# Patient Record
Sex: Female | Born: 1960 | Race: White | Hispanic: No | Marital: Married
Health system: Southern US, Community
[De-identification: ages and names within clinical notes are randomized; demographics above are authoritative.]

---

## 2004-04-05 ENCOUNTER — Other Ambulatory Visit: Admission: RE | Admit: 2004-04-05 | Discharge: 2004-04-05 | Payer: Self-pay | Admitting: Family Medicine

## 2006-06-02 ENCOUNTER — Ambulatory Visit: Payer: Self-pay | Admitting: Family Medicine

## 2006-06-10 ENCOUNTER — Ambulatory Visit: Payer: Self-pay | Admitting: Family Medicine

## 2006-06-10 ENCOUNTER — Other Ambulatory Visit: Admission: RE | Admit: 2006-06-10 | Discharge: 2006-06-10 | Payer: Self-pay | Admitting: Family Medicine

## 2007-10-07 ENCOUNTER — Other Ambulatory Visit: Admission: RE | Admit: 2007-10-07 | Discharge: 2007-10-07 | Payer: Self-pay | Admitting: Family Medicine

## 2007-10-07 ENCOUNTER — Ambulatory Visit: Payer: Self-pay | Admitting: Family Medicine

## 2007-12-14 ENCOUNTER — Ambulatory Visit: Payer: Self-pay | Admitting: Family Medicine

## 2008-02-08 ENCOUNTER — Ambulatory Visit: Payer: Self-pay | Admitting: Family Medicine

## 2008-10-18 ENCOUNTER — Ambulatory Visit: Payer: Self-pay | Admitting: Family Medicine

## 2008-10-18 ENCOUNTER — Other Ambulatory Visit: Admission: RE | Admit: 2008-10-18 | Discharge: 2008-10-18 | Payer: Self-pay | Admitting: Family Medicine

## 2008-11-21 ENCOUNTER — Ambulatory Visit: Payer: Self-pay | Admitting: Family Medicine

## 2008-11-21 ENCOUNTER — Encounter: Admission: RE | Admit: 2008-11-21 | Discharge: 2008-11-21 | Payer: Self-pay | Admitting: Family Medicine

## 2009-05-23 ENCOUNTER — Ambulatory Visit: Payer: Self-pay | Admitting: Family Medicine

## 2009-09-13 IMAGING — US US TRANSVAGINAL NON-OB
1 series · 14 of 25 positions shown · non-contrast
Comparison: Dictated report from that outside CT of the abdomen
pelvis

CLINICAL DATA: 2 cm left adnexal lesion noted on CT from [facility] dated 09/09/2008

TRANSABDOMINAL AND TRANSVAGINAL ULTRASOUND OF PELVIS
TECHNIQUE: Both transabdominal and transvaginal ultrasound
examinations of the pelvis were performed including evaluation of
the uterus, ovaries, adnexal regions, and pelvic cul-de-sac.

[Series 1: us transvaginal non-ob · 0.33mm/px · 87 acquisitions, 14 frames shown]
[im 1/87]
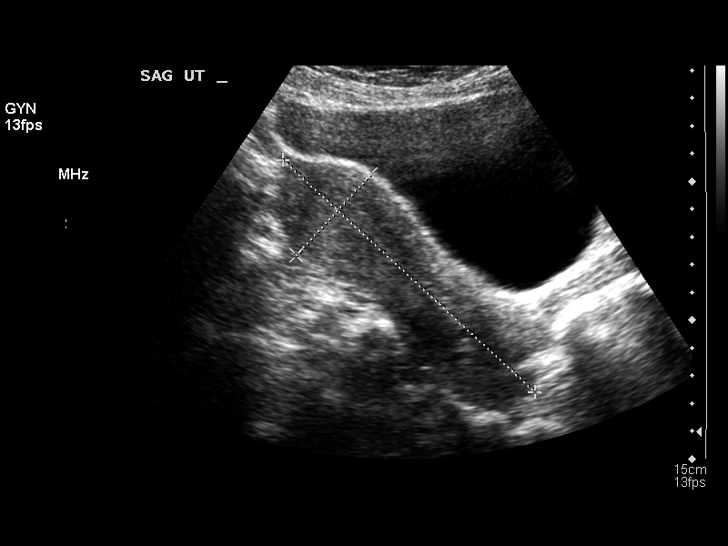
[im 8/87]
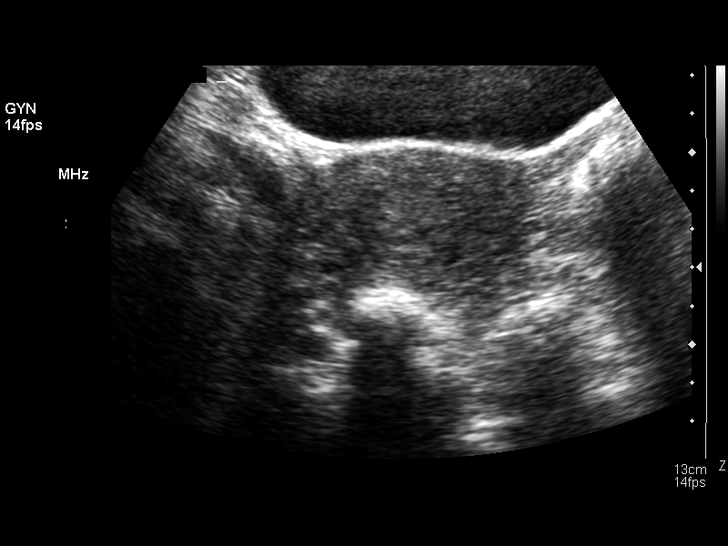
[im 15/87]
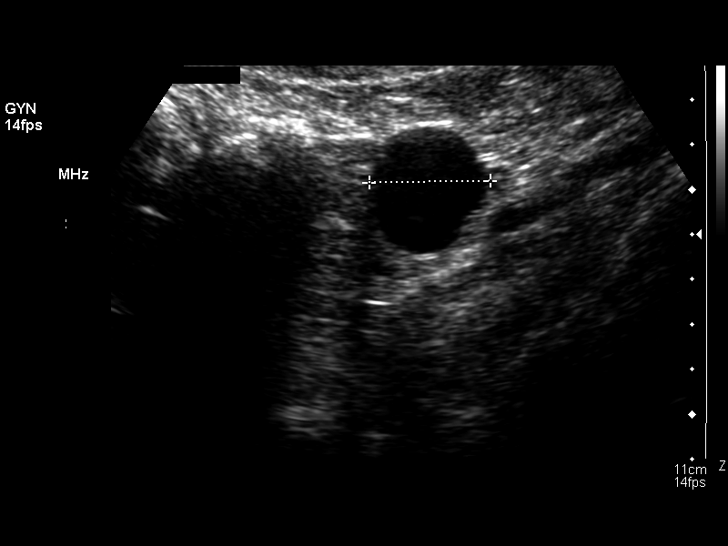
[im 22/87]
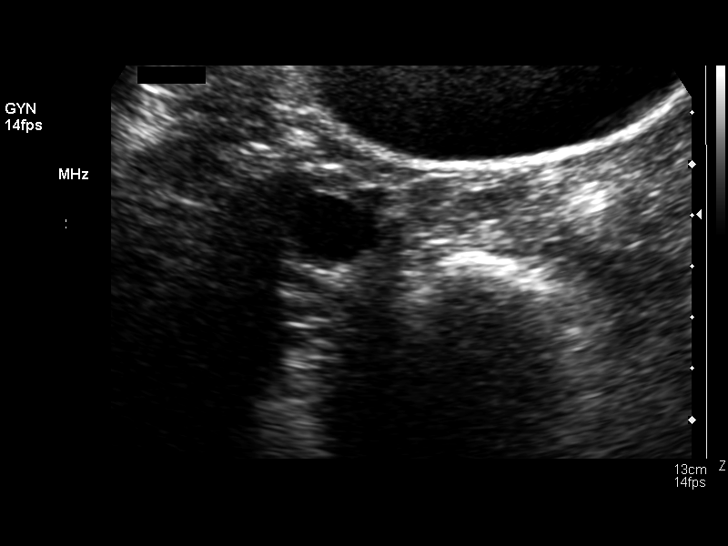
[im 29/87]
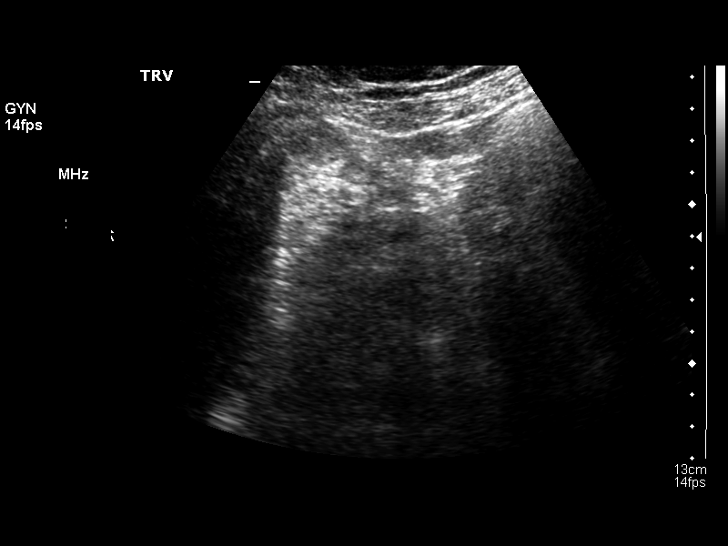
[im 33/87]
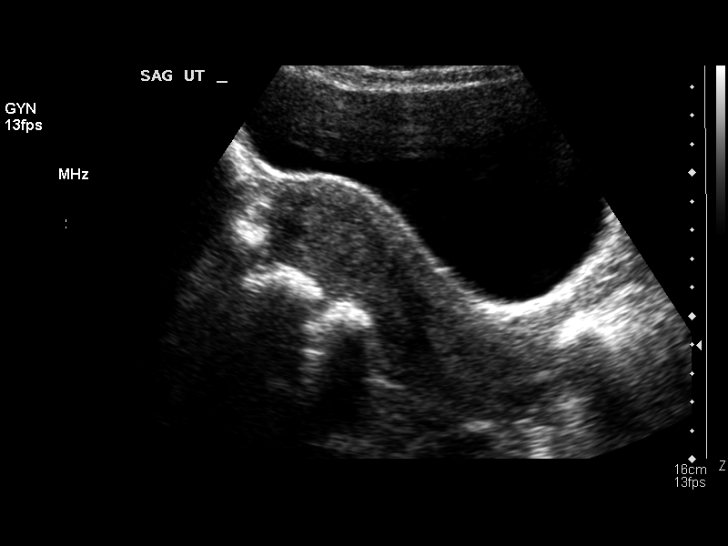
[im 40/87]
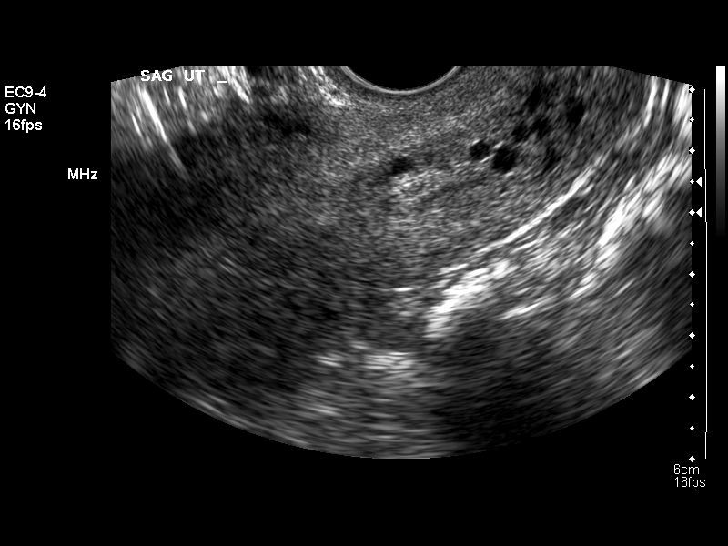
[im 47/87]
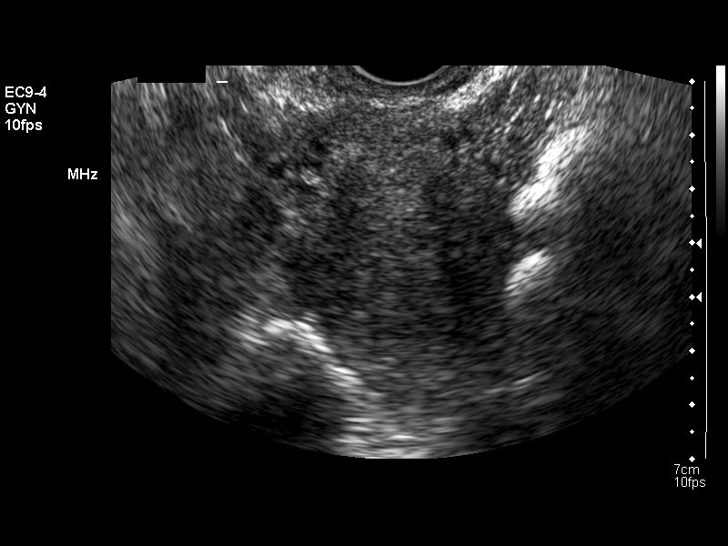
[im 54/87]
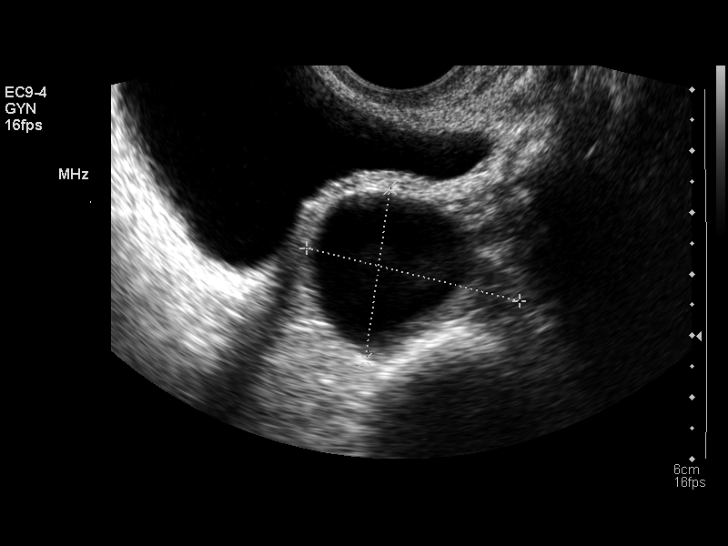
[im 58/87]
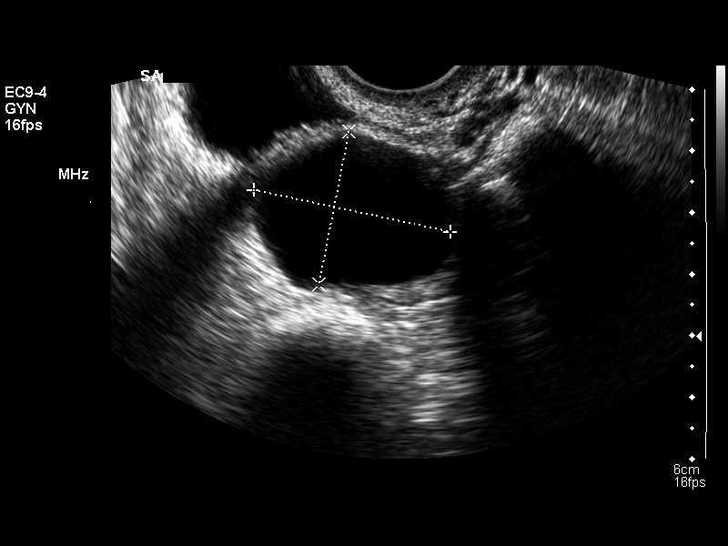
[im 65/87]
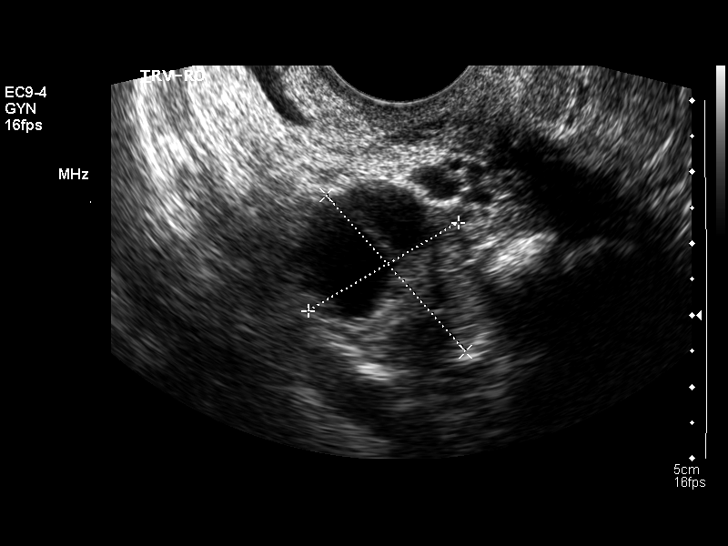
[im 72/87]
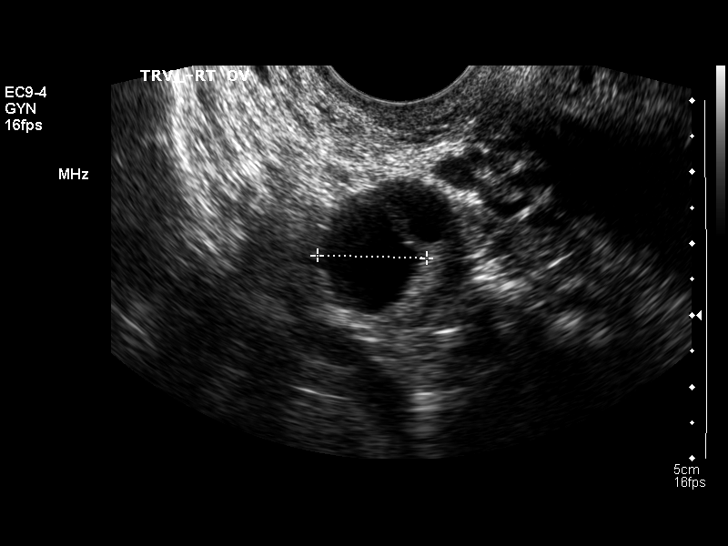
[im 79/87]
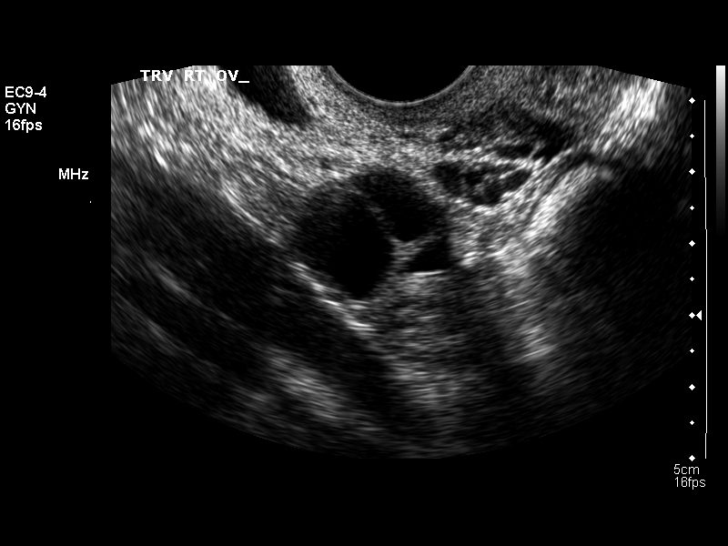
[im 87/87]
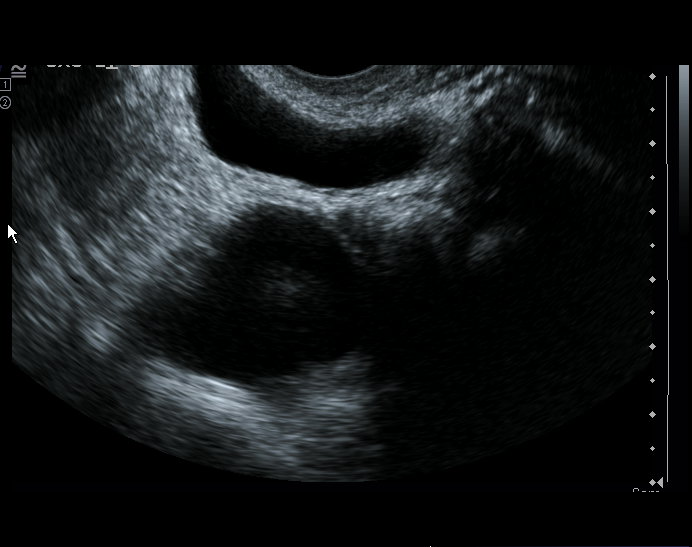

[14 of 25 positions shown; findings below may reference images not displayed]

FINDINGS: Uterus:  The uterus measures 10.8 cm sagittally with a depth of
cm and width of 5.6 cm.  Small Nabothian cysts are present.

Endometrium:  Endometrium appears homogeneous measuring 13.8 mm in
thickness which is within normal limits in this  patient.

Right Ovary:  The right ovary measures 2.9 x 2.4 x 2.7 cm.  Small
cysts are noted of 1.6 x 1.6 x 1.5 cm, and 0.9 x 1.4 x 1.4 cm.

Left Ovary:  The left ovary measures 4.1 x 2.8 x 3.6 cm.  There is
a simple appearing cyst of 3.3 x 2.5 x 3.0 cm.  No complicating
features are noted.

Other Findings:  No free fluid is seen.
IMPRESSION: 1.  3.3 cm simple appearing left ovarian cyst.  Small right ovarian
follicles.  No complicating features are evident.
 2.  The endometrium appears normal.

## 2009-12-12 ENCOUNTER — Ambulatory Visit: Payer: Self-pay | Admitting: Physician Assistant

## 2009-12-12 ENCOUNTER — Other Ambulatory Visit
Admission: RE | Admit: 2009-12-12 | Discharge: 2009-12-12 | Payer: Self-pay | Source: Home / Self Care | Admitting: Family Medicine

## 2010-06-12 ENCOUNTER — Ambulatory Visit: Payer: Self-pay | Admitting: Family Medicine

## 2010-09-09 ENCOUNTER — Encounter: Payer: Self-pay | Admitting: Family Medicine

## 2016-08-01 ENCOUNTER — Ambulatory Visit: Payer: Self-pay | Admitting: Sports Medicine

## 2016-08-02 ENCOUNTER — Ambulatory Visit (INDEPENDENT_AMBULATORY_CARE_PROVIDER_SITE_OTHER): Payer: Self-pay | Admitting: Sports Medicine

## 2016-08-02 ENCOUNTER — Encounter: Payer: Self-pay | Admitting: Sports Medicine

## 2016-08-02 ENCOUNTER — Ambulatory Visit (INDEPENDENT_AMBULATORY_CARE_PROVIDER_SITE_OTHER): Payer: Self-pay

## 2016-08-02 DIAGNOSIS — S92352A Displaced fracture of fifth metatarsal bone, left foot, initial encounter for closed fracture: Secondary | ICD-10-CM

## 2016-08-02 DIAGNOSIS — R609 Edema, unspecified: Secondary | ICD-10-CM

## 2016-08-02 DIAGNOSIS — M79672 Pain in left foot: Secondary | ICD-10-CM

## 2016-08-02 NOTE — Progress Notes (Signed)
Subjective: Stefanie Jones is a 55 y.o. female patient who presents to office for evaluation of  Left foot pain. Patient complains of progressive pain especially over the last few days. Patient went to urgent care earlier this week where she was diagnosed with metatarsal fracture. Patient states that when she was leaving work. She took a misstep and experienced pain and swelling. Patient reports that she was placed in a CAM boot and told to rest, ice and elevate the foot and to take meloxicam and to follow up with podiatrist. Patient denies any other pedal complaints.   There are no active problems to display for this patient.   No current outpatient prescriptions on file prior to visit.   No current facility-administered medications on file prior to visit.     Not on File  Objective:  General: Alert and oriented x3 in no acute distress  Dermatology: No open lesions bilateral lower extremities, no webspace macerations, no ecchymosis bilateral, all nails x 10 are well manicured.  Vascular: Focal edema noted to Dorsal lateral aspect of the left foot. Dorsalis Pedis and Posterior Tibial pedal pulses 2/4, Capillary Fill Time 3 seconds,(+) pedal hair growth bilateral, Temperature gradient within normal limits.  Neurology: Michaell CowingGross sensation intact via light touch bilateral.  Musculoskeletal: There is tenderness with palpation at fifth metatarsal, especially at the base on the left foot, there is pain present with application of tuning fork on the left foot, No pain with calf compression bilateral. Strength within normal limits for patient status.   Patient brought in x-rays on CD, which I reviewed from urgent care dated 07/30/2016 which reveals a nondisplaced transverse fracture of the base of the fifth metatarsal with no comminution extra-articular with mild midfoot arthritis and no other acute findings    Assessment and Plan: Problem List Items Addressed This Visit    None    Visit Diagnoses     Closed displaced fracture of fifth metatarsal bone of left foot, initial encounter    -  Primary   Swelling       Left foot pain       Relevant Orders   DG Foot Complete Left      -Complete examination performed -Xrays reviewed -Discussed treatement options for fracture; risks, alternatives, and benefits explained. 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 I advised patient if her job could not give her a lighter duty position that she will have to take medical leave from work until we get this fracture heal. Patient expressed understanding and will get paperwork sent to office to be completed on her behalf. -Patient to return to office in 4 weeks for serial x-rays to assess healing  or sooner if condition worsens.  Asencion Islamitorya Mikel Hardgrove, DPM

## 2016-09-06 ENCOUNTER — Ambulatory Visit (INDEPENDENT_AMBULATORY_CARE_PROVIDER_SITE_OTHER): Payer: BLUE CROSS/BLUE SHIELD | Admitting: Sports Medicine

## 2016-09-06 ENCOUNTER — Encounter: Payer: Self-pay | Admitting: Sports Medicine

## 2016-09-06 ENCOUNTER — Ambulatory Visit: Payer: Self-pay | Admitting: Sports Medicine

## 2016-09-06 ENCOUNTER — Ambulatory Visit (INDEPENDENT_AMBULATORY_CARE_PROVIDER_SITE_OTHER): Payer: BLUE CROSS/BLUE SHIELD

## 2016-09-06 DIAGNOSIS — S92355A Nondisplaced fracture of fifth metatarsal bone, left foot, initial encounter for closed fracture: Secondary | ICD-10-CM | POA: Diagnosis not present

## 2016-09-06 DIAGNOSIS — Q828 Other specified congenital malformations of skin: Secondary | ICD-10-CM | POA: Diagnosis not present

## 2016-09-06 DIAGNOSIS — S92355D Nondisplaced fracture of fifth metatarsal bone, left foot, subsequent encounter for fracture with routine healing: Secondary | ICD-10-CM

## 2016-09-06 DIAGNOSIS — M79671 Pain in right foot: Secondary | ICD-10-CM

## 2016-09-06 DIAGNOSIS — L309 Dermatitis, unspecified: Secondary | ICD-10-CM

## 2016-09-06 DIAGNOSIS — M79672 Pain in left foot: Secondary | ICD-10-CM

## 2016-09-06 MED ORDER — DESONIDE 0.05 % EX OINT: 1.0000 "application " | TOPICAL_OINTMENT | Freq: Two times a day (BID) | CUTANEOUS | 0 refills | Status: AC

## 2016-09-06 NOTE — Progress Notes (Signed)
Subjective: Stefanie Jones is a 56 y.o. female patient who returns to office for evaluation of left foot fracture, right 3-4 toes color change with burning sensation and hard lesions on bottom of right foot concerning for warts; states that she had warts as a child. Patient denies any other pedal complaints.   There are no active problems to display for this patient.   Current Outpatient Prescriptions on File Prior to Visit  Medication Sig Dispense Refill  . lisinopril (PRINIVIL,ZESTRIL) 5 MG tablet     . meloxicam (MOBIC) 7.5 MG tablet      No current facility-administered medications on file prior to visit.     Not on File  Objective:  General: Alert and oriented x3 in no acute distress  Dermatology: Scaly rash to right 3-4 toes, Porokeratosis sub met 2-3 on right, No open lesions bilateral lower extremities, no webspace macerations, no ecchymosis bilateral, all nails x 10 are well manicured.  Vascular: Focal edema noted to Dorsal lateral aspect of the left foot. Dorsalis Pedis and Posterior Tibial pedal pulses 2/4, Capillary Fill Time 3 seconds,(+) pedal hair growth bilateral, Temperature gradient within normal limits.  Neurology: Gross sensation intact via light touch bilateral.  Musculoskeletal: No pain to right 3-4 toes.There is decreased tenderness with palpation at fifth metatarsal, especially at the base on the left foot, there is pain present with application of tuning fork on the left foot, No pain with calf compression bilateral. Strength within normal limits for patient status.   Xray left foot- fifth metatarsal fracture with no comminution extra-articular with mild midfoot arthritis and no other acute findings    Assessment and Plan: Problem List Items Addressed This Visit    None    Visit Diagnoses    Closed nondisplaced fracture of fifth metatarsal bone of left foot with routine healing, subsequent encounter    -  Primary   Relevant Orders   DG Foot Complete Left   Dermatitis       Relevant Medications   desonide (DESOWEN) 0.05 % ointment   Porokeratosis       Foot pain, bilateral          -Complete examination performed -Mechanically debrided keratosis sub met 2-3 on right foot using sterile chisel blade without incident  -Rx Desonide cream for right 3-4 toes -Xrays reviewed -Discussed treatement options for fracture; risks, alternatives, and benefits explained. -Continue with surgitube compression stockings daily -Continue with CAM walker to patient to wear at all times -Recommend protection, rest, ice, elevation daily until symptoms improve -Continue with antinflammatories as needed -Patient states job released her on medical leave until 09-17-16. I advised patient if she needs additional work notes to let office know; recommend she continue with no work until next appointment. -Patient to return to office in 4 weeks for serial x-rays to assess healing  or sooner if condition worsens.  Landis Martins, DPM

## 2016-09-09 ENCOUNTER — Encounter: Payer: Self-pay | Admitting: Sports Medicine

## 2016-10-04 ENCOUNTER — Ambulatory Visit (INDEPENDENT_AMBULATORY_CARE_PROVIDER_SITE_OTHER): Payer: BLUE CROSS/BLUE SHIELD

## 2016-10-04 ENCOUNTER — Ambulatory Visit (INDEPENDENT_AMBULATORY_CARE_PROVIDER_SITE_OTHER): Payer: BLUE CROSS/BLUE SHIELD | Admitting: Sports Medicine

## 2016-10-04 ENCOUNTER — Encounter: Payer: Self-pay | Admitting: Sports Medicine

## 2016-10-04 DIAGNOSIS — S92355D Nondisplaced fracture of fifth metatarsal bone, left foot, subsequent encounter for fracture with routine healing: Secondary | ICD-10-CM | POA: Diagnosis not present

## 2016-10-04 DIAGNOSIS — M79672 Pain in left foot: Secondary | ICD-10-CM

## 2016-10-04 NOTE — Progress Notes (Signed)
Subjective: Rondel JumboDonna V Paulding is a 56 y.o. female patient who returns to office for evaluation of left foot fracture. Patient has been wearing CAM boot and reports that she is concerned with her Leave from work, Patient denies any other pedal complaints.   There are no active problems to display for this patient.   Current Outpatient Prescriptions on File Prior to Visit  Medication Sig Dispense Refill  . desonide (DESOWEN) 0.05 % ointment Apply 1 application topically 2 (two) times daily. 15 g 0  . lisinopril (PRINIVIL,ZESTRIL) 5 MG tablet     . meloxicam (MOBIC) 7.5 MG tablet      No current facility-administered medications on file prior to visit.     Not on File  Objective:  General: Alert and oriented x3 in no acute distress  Dermatology: Left foot nails well manicured. No ecchymoses or skin rash on left.  Vascular: Decreased focal edema noted to Dorsal lateral aspect of the left foot. Dorsalis Pedis and Posterior Tibial pedal pulses 2/4, Capillary Fill Time 3 seconds,(+) pedal hair growth,Temperature gradient within normal limits.  Neurology: Gross sensation intact via light touch on left.  Musculoskeletal: There is decreased tenderness with palpation at fifth metatarsal, especially at the base on the left foot, No pain with calf compression. Strength within normal limits for patient status.   Xray left foot- fifth metatarsal fracture with 2mm gap only 30% improved with no comminution extra-articular with mild midfoot arthritis and no other acute findings    Assessment and Plan: Problem List Items Addressed This Visit    None    Visit Diagnoses    Closed nondisplaced fracture of fifth metatarsal bone of left foot with routine healing, subsequent encounter    -  Primary   Relevant Orders   DG Foot Complete Left   Left foot pain          -Complete examination performed -Xrays reviewed -Discussed treatement options and continued care fracture; risks, alternatives, and  benefits explained. -Discussed with patient will request bone stimulator at next visit if fracture is not healed -Continue with surgitube compression stockings daily -Continue with CAM walker to patient to wear at all times -Recommend protection, rest, ice, elevation daily until symptoms improve -Continue with antinflammatories as needed -Patient to continue with medical leave until fracture has healed; recommend continue with no work, return to work status will be re-assessed at next visit. -Patient to return to office in 4 weeks for serial x-rays to assess healing of fracture or sooner if condition worsens.  Asencion Islamitorya Chaniya Genter, DPM

## 2016-11-01 ENCOUNTER — Ambulatory Visit (INDEPENDENT_AMBULATORY_CARE_PROVIDER_SITE_OTHER): Payer: BLUE CROSS/BLUE SHIELD | Admitting: Sports Medicine

## 2016-11-01 ENCOUNTER — Encounter: Payer: Self-pay | Admitting: Sports Medicine

## 2016-11-01 ENCOUNTER — Ambulatory Visit (INDEPENDENT_AMBULATORY_CARE_PROVIDER_SITE_OTHER): Payer: BLUE CROSS/BLUE SHIELD

## 2016-11-01 DIAGNOSIS — S92355K Nondisplaced fracture of fifth metatarsal bone, left foot, subsequent encounter for fracture with nonunion: Secondary | ICD-10-CM | POA: Diagnosis not present

## 2016-11-01 DIAGNOSIS — M79672 Pain in left foot: Secondary | ICD-10-CM | POA: Diagnosis not present

## 2016-11-01 DIAGNOSIS — S92355D Nondisplaced fracture of fifth metatarsal bone, left foot, subsequent encounter for fracture with routine healing: Secondary | ICD-10-CM | POA: Diagnosis not present

## 2016-11-01 NOTE — Progress Notes (Signed)
Subjective: Stefanie Jones is a 56 y.o. female patient who returns to office for evaluation of left foot fracture. Patient has been wearing CAM boot and reports that the swelling is a little better. Patient reports that her FMLA runs out today. Patient DOI 07-30-16. Patient denies any other pedal complaints.   There are no active problems to display for this patient.   Current Outpatient Prescriptions on File Prior to Visit  Medication Sig Dispense Refill  . desonide (DESOWEN) 0.05 % ointment Apply 1 application topically 2 (two) times daily. 15 g 0  . lisinopril (PRINIVIL,ZESTRIL) 5 MG tablet     . meloxicam (MOBIC) 7.5 MG tablet      No current facility-administered medications on file prior to visit.     Not on File  Objective:  General: Alert and oriented x3 in no acute distress  Dermatology: Left foot nails well manicured. No ecchymoses or skin rash on left.  Vascular: Decreased focal edema noted to Dorsal lateral aspect of the left foot. Dorsalis Pedis and Posterior Tibial pedal pulses 2/4, Capillary Fill Time 3 seconds,(+) pedal hair growth,Temperature gradient within normal limits.  Neurology: Gross sensation intact via light touch on left.  Musculoskeletal: There is decreased tenderness with palpation at fifth metatarsal, especially at the base on the left foot, No pain with calf compression. Strength within normal limits for patient status.   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 -Rx Orthofix bone stimulator to assist with fracture healing  -Rx compression anklet to use for edema control  -Continue with CAM walker to patient to wear at all times -Recommend protection, rest, ice, elevation daily until symptoms improve -Continue with antinflammatories as needed -Patient to continue with medical leave for the next 6 weeks. Patient can not return to work at this time unless sitting modified or lighter duty and CAM boot is allowed.  -Patient to return to office in 4 weeks for serial x-rays to assess healing of fracture or sooner if condition worsens.  Stefanie Jones, DPM

## 2016-11-04 ENCOUNTER — Telehealth: Payer: Self-pay | Admitting: *Deleted

## 2016-11-04 NOTE — Telephone Encounter (Addendum)
-----  Message from Landis Martins, [place] sent at 11/01/2016 10:04 AM EDT ----- Regarding: Orthofix bone stim Orthofix bone stim the one that can be used with a CAM boot Left 5th met base fracture -Dr Chauncey Cruel. 11/04/2016-I informed Lytle Michaels - [facility] of Dr. Leeanne Rio orders. Lytle Michaels picked up clinical, demographics and script for [facility].

## 2016-11-29 ENCOUNTER — Ambulatory Visit (INDEPENDENT_AMBULATORY_CARE_PROVIDER_SITE_OTHER): Payer: BLUE CROSS/BLUE SHIELD

## 2016-11-29 ENCOUNTER — Encounter: Payer: Self-pay | Admitting: Sports Medicine

## 2016-11-29 ENCOUNTER — Ambulatory Visit (INDEPENDENT_AMBULATORY_CARE_PROVIDER_SITE_OTHER): Payer: BLUE CROSS/BLUE SHIELD | Admitting: Sports Medicine

## 2016-11-29 DIAGNOSIS — S92355K Nondisplaced fracture of fifth metatarsal bone, left foot, subsequent encounter for fracture with nonunion: Secondary | ICD-10-CM

## 2016-11-29 DIAGNOSIS — M79672 Pain in left foot: Secondary | ICD-10-CM | POA: Diagnosis not present

## 2016-11-29 NOTE — Progress Notes (Signed)
  Subjective: EREN PUEBLA is a 56 y.o. female patient who returns to office for evaluation of left foot fracture. Patient has been wearing CAM boot and reports that the swelling is a little better, started [employer] bone stim 2 weeks ago. Patient reports that her Disability runs out today and needs more documentation. Patient DOI 07-30-16. Patient denies any other pedal complaints.   There are no active problems to display for this patient.   Current Outpatient Prescriptions on File Prior to Visit  Medication Sig Dispense Refill  . desonide (DESOWEN) 0.05 % ointment Apply 1 application topically 2 (two) times daily. 15 g 0  . lisinopril (PRINIVIL,ZESTRIL) 5 MG tablet     . meloxicam (MOBIC) 7.5 MG tablet      No current facility-administered medications on file prior to visit.     Not on File  Objective:  General: Alert and oriented x3 in no acute distress  Dermatology: Left foot nails well manicured. No ecchymoses or skin rash on left.  Vascular: Decreased focal edema noted to Dorsal lateral aspect of the left foot. Dorsalis Pedis and Posterior Tibial pedal pulses 2/4, Capillary Fill Time 3 seconds,(+) pedal hair growth,Temperature gradient within normal limits.  Neurology: Gross sensation intact via light touch on left.  Musculoskeletal: There is decreased tenderness with palpation at fifth metatarsal, especially at the base on the left foot, No pain with calf compression. Strength within normal limits for patient status.   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 -Continue with Orthofix bone stimulator to assist with fracture healing  -Continue compression anklet to use for edema control  -Continue with CAM walker to patient to wear at all times -Recommend protection, rest, ice, elevation daily until symptoms improve -Continue with antinflammatories as needed -Patient to continue with medical leave and disability for the next 8 weeks. Patient can not return to work at this time unless sitting modified or lighter duty and CAM boot is allowed.  -Patient to return to office in 4 weeks for serial x-rays to assess healing of fracture or sooner if condition worsens.  Asencion Islam, DPM

## 2016-12-27 ENCOUNTER — Encounter: Payer: Self-pay | Admitting: Sports Medicine

## 2016-12-27 ENCOUNTER — Ambulatory Visit (INDEPENDENT_AMBULATORY_CARE_PROVIDER_SITE_OTHER): Payer: BLUE CROSS/BLUE SHIELD | Admitting: Sports Medicine

## 2016-12-27 ENCOUNTER — Ambulatory Visit (INDEPENDENT_AMBULATORY_CARE_PROVIDER_SITE_OTHER): Payer: BLUE CROSS/BLUE SHIELD

## 2016-12-27 DIAGNOSIS — S92355K Nondisplaced fracture of fifth metatarsal bone, left foot, subsequent encounter for fracture with nonunion: Secondary | ICD-10-CM

## 2016-12-27 DIAGNOSIS — M79672 Pain in left foot: Secondary | ICD-10-CM

## 2016-12-27 NOTE — Progress Notes (Signed)
  Subjective: Stefanie Jones is a 56 y.o. female patient who returns to office for evaluation of left foot fracture. Patient has been wearing CAM boot and reports that the swelling is a little better and pain is better, started Exogen bone stim 6 weeks ago. Patient DOI 07-30-16. Patient denies any other pedal complaints.   There are no active problems to display for this patient.   Current Outpatient Prescriptions on File Prior to Visit  Medication Sig Dispense Refill  . desonide (DESOWEN) 0.05 % ointment Apply 1 application topically 2 (two) times daily. 15 g 0  . lisinopril (PRINIVIL,ZESTRIL) 5 MG tablet     . meloxicam (MOBIC) 7.5 MG tablet      No current facility-administered medications on file prior to visit.     Not on File  Objective:  General: Alert and oriented x3 in no acute distress  Dermatology: Left foot nails well manicured. No ecchymoses or skin rash on left.  Vascular: Decreased focal edema noted to Dorsal lateral aspect of the left foot. Dorsalis Pedis and Posterior Tibial pedal pulses 2/4, Capillary Fill Time 3 seconds,(+) pedal hair growth,Temperature gradient within normal limits.  Neurology: Gross sensation intact via light touch on left.  Musculoskeletal: There is decreased tenderness with palpation at fifth metatarsal, especially at the base on the left foot, No pain with calf compression. Strength within normal limits for patient status.   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 -Continue with Orthofix bone stimulator to assist with fracture healing  -Continue compression anklet to use for edema control  -Continue with CAM walker to patient to wear at all times -Recommend protection, rest, ice, elevation daily until symptoms improve -Continue with antinflammatories as needed -Patient to continue with medical leave and disability for the next 4-5 weeks. Patient can not return to work at this time unless sitting modified or lighter duty and CAM boot is allowed. Possible return June 19 with stiff sole shoe and continuing with bone stim to ensure complete healing  -Patient to return to office in 4 weeks for serial x-rays to assess healing of fracture or sooner if condition worsens.  Asencion Islamitorya Gianne Shugars, DPM

## 2017-01-24 ENCOUNTER — Ambulatory Visit (INDEPENDENT_AMBULATORY_CARE_PROVIDER_SITE_OTHER): Payer: BLUE CROSS/BLUE SHIELD | Admitting: Sports Medicine

## 2017-01-24 ENCOUNTER — Encounter: Payer: Self-pay | Admitting: Sports Medicine

## 2017-01-24 ENCOUNTER — Ambulatory Visit (INDEPENDENT_AMBULATORY_CARE_PROVIDER_SITE_OTHER): Payer: BLUE CROSS/BLUE SHIELD

## 2017-01-24 DIAGNOSIS — M79672 Pain in left foot: Secondary | ICD-10-CM

## 2017-01-24 DIAGNOSIS — S92355D Nondisplaced fracture of fifth metatarsal bone, left foot, subsequent encounter for fracture with routine healing: Secondary | ICD-10-CM | POA: Diagnosis not present

## 2017-01-24 DIAGNOSIS — M792 Neuralgia and neuritis, unspecified: Secondary | ICD-10-CM | POA: Diagnosis not present

## 2017-01-24 MED ORDER — GABAPENTIN 300 MG PO CAPS: 300.0000 mg | ORAL_CAPSULE | Freq: Every day | ORAL | 3 refills | Status: AC

## 2017-01-24 NOTE — Progress Notes (Signed)
    Subjective: Stefanie Jones is a 56 y.o. female patient who returns to office for evaluation of left foot fracture. Patient has been wearing CAM boot and reports that the swelling and pain is better, started Exogen bone stim 10 weeks ago. Patient DOI 07-30-16. Patient denies any other pedal complaints.   There are no active problems to display for this patient.   Current Outpatient Prescriptions on File Prior to Visit  Medication Sig Dispense Refill  . desonide (DESOWEN) 0.05 % ointment Apply 1 application topically 2 (two) times daily. 15 g 0  . lisinopril (PRINIVIL,ZESTRIL) 5 MG tablet     . meloxicam (MOBIC) 7.5 MG tablet      No current facility-administered medications on file prior to visit.     Not on File  Objective:  General: Alert and oriented x3 in no acute distress  Dermatology: Left foot nails well manicured. No ecchymoses or skin rash on left.  Vascular: Decreased focal edema noted to Dorsal lateral aspect of the left foot. Dorsalis Pedis and Posterior Tibial pedal pulses 2/4, Capillary Fill Time 3 seconds,(+) pedal hair growth,Temperature gradient within normal limits.  Neurology: Gross sensation intact via light touch on left. Occasional neuritis or nerve pain.    Musculoskeletal: There is decreased tenderness with palpation at fifth metatarsal, especially at the base on the left foot, No pain with calf compression. Strength within normal limits for patient status.   Xray left foot- fifth metatarsal fracture with 2mm gap only about 90% improved, unchanged from prior with no comminution extra-articular with mild midfoot arthritis and no other acute findings    Assessment and Plan: Problem List Items Addressed This Visit    None    Visit Diagnoses    Closed nondisplaced fracture of fifth metatarsal bone of left foot with routine healing, subsequent encounter    -  Primary   Relevant Orders   DG Foot Complete Left (Completed)   Left foot pain       Neuritis        Relevant Medications   gabapentin (NEURONTIN) 300 MG capsule      -Complete examination performed -Xrays reviewed -Discussed treatement options and continued care fracture; risks, alternatives, and benefits explained. -Continue with Orthofix bone stimulator to assist with fracture healing  -Continue compression anklet to use for edema control  -May now wear a stiff sole shoe  -Recommend protection, rest, ice, elevation daily  -Continue with antinflammatories as needed -Patient to continue with medical leave and disability until June 19   -Patient to return to office in 4 weeks for serial x-rays to assess healing of fracture or sooner if condition worsens.  Asencion Islamitorya Kentrell Guettler, DPM

## 2017-01-24 NOTE — Patient Instructions (Signed)
For tennis shoes recommend:  Brooks Beast Ascis New balance Saucony Can be purchased at [employer] or [employer]  Vionic  SAS Can be purchased at [employer] or [employer]   For work shoes recommend: Sketchers Work Timberland boots  Can be purchased at a variety of places or [employer]   For casual shoes recommend: Vionic  Can be purchased at [employer] or [employer]  

## 2017-02-21 ENCOUNTER — Ambulatory Visit (INDEPENDENT_AMBULATORY_CARE_PROVIDER_SITE_OTHER): Payer: BLUE CROSS/BLUE SHIELD

## 2017-02-21 ENCOUNTER — Ambulatory Visit (INDEPENDENT_AMBULATORY_CARE_PROVIDER_SITE_OTHER): Payer: BLUE CROSS/BLUE SHIELD | Admitting: Sports Medicine

## 2017-02-21 ENCOUNTER — Other Ambulatory Visit: Payer: Self-pay | Admitting: Sports Medicine

## 2017-02-21 DIAGNOSIS — M79672 Pain in left foot: Secondary | ICD-10-CM | POA: Diagnosis not present

## 2017-02-21 DIAGNOSIS — S92355D Nondisplaced fracture of fifth metatarsal bone, left foot, subsequent encounter for fracture with routine healing: Secondary | ICD-10-CM | POA: Diagnosis not present

## 2017-02-21 DIAGNOSIS — M79671 Pain in right foot: Secondary | ICD-10-CM | POA: Diagnosis not present

## 2017-02-21 DIAGNOSIS — R52 Pain, unspecified: Secondary | ICD-10-CM | POA: Diagnosis not present

## 2017-02-21 MED ORDER — IBUPROFEN 800 MG PO TABS: 800.0000 mg | ORAL_TABLET | Freq: Two times a day (BID) | ORAL | 0 refills | Status: AC

## 2017-02-21 MED ORDER — FAMOTIDINE 20 MG PO TABS: 20.0000 mg | ORAL_TABLET | Freq: Two times a day (BID) | ORAL | 0 refills | Status: AC

## 2017-02-21 MED ORDER — IBUPROFEN-FAMOTIDINE 800-26.6 MG PO TABS: ORAL_TABLET | ORAL | 1 refills | Status: AC

## 2017-02-21 NOTE — Progress Notes (Signed)
Subjective: Stefanie Jones is a 56 y.o. female patient who returns to office for evaluation of left foot fracture. Patient DOI [birth year]. Patient is back at work. Reports that progressively after about 3 hours a work both her feet are painful and swollen. States that she gets no relief with the gabapentin. However, when she takes Motrin this helped tremendously. Patient is concern for other changes with feet because pain is all over and pain has become progressive with overuse. Patient admits to a significant family history arthritides. Patient denies any other pedal complaints.   There are no active problems to display for this patient.   Current Outpatient Prescriptions on File Prior to Visit  Medication Sig Dispense Refill  . desonide (DESOWEN) 0.05 % ointment Apply 1 application topically 2 (two) times daily. 15 g 0  . gabapentin (NEURONTIN) 300 MG capsule Take 1 capsule (300 mg total) by mouth at bedtime. 90 capsule 3  . lisinopril (PRINIVIL,ZESTRIL) 5 MG tablet     . meloxicam (MOBIC) 7.5 MG tablet      No current facility-administered medications on file prior to visit.     Not on File  Objective:  General: Alert and oriented x3 in no acute distress  Dermatology: Left foot nails well manicured. No ecchymoses or skin rash on left.  Vascular: Decreased focal edema noted to Dorsal lateral aspect of the left foot. Dorsalis Pedis and Posterior Tibial pedal pulses 2/4, Capillary Fill Time 3 seconds,(+) pedal hair growth,Temperature gradient within normal limits.  Neurology: Gross sensation intact via light touch on left. Occasional neuritis or nerve pain.    Musculoskeletal: There is No tenderness with palpation at fifth metatarsal, especially at the base on the left foot, subjective pain all over 2 both feet, especially with work, No pain with calf compression. Strength within normal limits for patient status.   Xray left foot- fifth metatarsal fracture 100% improved with no  refracture, no comminution with mild midfoot arthritis and no other acute findings    Assessment and Plan: Problem List Items Addressed This Visit    None    Visit Diagnoses    Closed nondisplaced fracture of fifth metatarsal bone of left foot with routine healing, subsequent encounter    -  Primary   healed   Relevant Orders   DG Foot Complete Left (Completed)   Inflammatory pain       Relevant Medications   Ibuprofen-Famotidine 800-26.6 MG TABS   Other Relevant Orders   CBC with Differential   Basic Metabolic Panel   Uric Acid   Sedimentation Rate   HLA-B27 Antigen   High sensitivity CRP   ANA   Rheumatoid factor   Foot pain, bilateral       Relevant Medications   Ibuprofen-Famotidine 800-26.6 MG TABS   Other Relevant Orders   CBC with Differential   Basic Metabolic Panel   Uric Acid   Sedimentation Rate   HLA-B27 Antigen   High sensitivity CRP   ANA   Rheumatoid factor      -Complete examination performed -Xrays reviewed -Discussed treatement options and continued careFor healed fracture; risks, alternatives, and benefits explained. -May discontinue bone stimulator at this time -Continue compression anklet to use for edema control  -Continue with good supportive shoes -Recommend protection, rest, ice, elevation daily as needed for foot pain -Continue with antinflammatories as needed, change Motrin to Duexis and ordered arthritic panel for further evaluation for diffuse foot pain, possible inflammatory in nature versus osteoarthritis  progressive -Continue with normal work without any restrictions  -Patient to return to office after blood work or sooner if condition worsens.  Landis Martins, DPM

## 2017-02-21 NOTE — Progress Notes (Signed)
Patient's insurance did not cover Duexis, so I sent Rx to pharmacy separate for patient to take together to protect stomach -Dr. SKathie Rhodes

## 2017-02-26 LAB — CBC WITH DIFFERENTIAL/PLATELET
BASOS: 0 %
Basophils Absolute: 0 10*3/uL (ref 0.0–0.2)
EOS (ABSOLUTE): 0.2 10*3/uL (ref 0.0–0.4)
EOS: 2 %
HEMATOCRIT: 37 % (ref 34.0–46.6)
HEMOGLOBIN: 12.1 g/dL (ref 11.1–15.9)
IMMATURE GRANS (ABS): 0 10*3/uL (ref 0.0–0.1)
IMMATURE GRANULOCYTES: 0 %
LYMPHS: 30 %
Lymphocytes Absolute: 2.4 10*3/uL (ref 0.7–3.1)
MCH: 27.8 pg (ref 26.6–33.0)
MCHC: 32.7 g/dL (ref 31.5–35.7)
MCV: 85 fL (ref 79–97)
MONOCYTES: 9 %
Monocytes Absolute: 0.7 10*3/uL (ref 0.1–0.9)
NEUTROS ABS: 4.8 10*3/uL (ref 1.4–7.0)
NEUTROS PCT: 59 %
Platelets: 298 10*3/uL (ref 150–379)
RBC: 4.35 x10E6/uL (ref 3.77–5.28)
RDW: 15 % (ref 12.3–15.4)
WBC: 8 10*3/uL (ref 3.4–10.8)

## 2017-02-26 LAB — SEDIMENTATION RATE: Sed Rate: 13 mm/hr (ref 0–40)

## 2017-02-26 LAB — BASIC METABOLIC PANEL
BUN / CREAT RATIO: 30 — AB (ref 9–23)
BUN: 21 mg/dL (ref 6–24)
CALCIUM: 10 mg/dL (ref 8.7–10.2)
CHLORIDE: 104 mmol/L (ref 96–106)
CO2: 22 mmol/L (ref 20–29)
CREATININE: 0.69 mg/dL (ref 0.57–1.00)
GFR calc non Af Amer: 98 mL/min/{1.73_m2} (ref >59)
GFR, EST AFRICAN AMERICAN: 113 mL/min/{1.73_m2} (ref >59)
Glucose: 91 mg/dL (ref 65–99)
Potassium: 4.7 mmol/L (ref 3.5–5.2)
Sodium: 141 mmol/L (ref 134–144)

## 2017-02-26 LAB — URIC ACID: Uric Acid: 4.4 mg/dL (ref 2.5–7.1)

## 2017-02-26 LAB — HIGH SENSITIVITY CRP: CRP HIGH SENSITIVITY: 3.21 mg/L — AB (ref 0.00–3.00)

## 2017-02-26 LAB — RHEUMATOID FACTOR: Rhuematoid fact SerPl-aCnc: <10 IU/mL (ref 0.0–13.9)

## 2017-02-26 LAB — ANA: ANA: NEGATIVE

## 2017-02-26 LAB — HLA-B27 ANTIGEN: HLA B27: NEGATIVE

## 2017-02-28 ENCOUNTER — Telehealth: Payer: Self-pay | Admitting: *Deleted

## 2017-02-28 NOTE — Telephone Encounter (Addendum)
-----   Message from Asencion Islamitorya Stover, [place] sent at 02/27/2017  9:41 AM EDT ----- Let patient know that her blood work had some mild elevation with inflammatory marker called CRP and with her kidney function. Let patient know that it was mild and that there is nothing to worry about; could be the result of dehydration or medications/anti-inflammatories. Advise patient that if she continues to require motrin for her general pain in both feet at a later time we may refer to a rheumatologist for an evaluation. -Dr. Marylene LandStover. 02/28/2017-I informed pt of Dr. Wynema BirchStover's review of labs and recommendations. Pt states understanding.

## 2021-07-30 DIAGNOSIS — I10 Essential (primary) hypertension: Secondary | ICD-10-CM | POA: Diagnosis not present

## 2021-07-30 DIAGNOSIS — Z8249 Family history of ischemic heart disease and other diseases of the circulatory system: Secondary | ICD-10-CM | POA: Diagnosis not present

## 2021-09-04 DIAGNOSIS — E119 Type 2 diabetes mellitus without complications: Secondary | ICD-10-CM | POA: Diagnosis not present

## 2021-10-09 DIAGNOSIS — E119 Type 2 diabetes mellitus without complications: Secondary | ICD-10-CM | POA: Diagnosis not present

## 2021-10-09 DIAGNOSIS — R079 Chest pain, unspecified: Secondary | ICD-10-CM | POA: Diagnosis not present

## 2021-10-09 DIAGNOSIS — I10 Essential (primary) hypertension: Secondary | ICD-10-CM | POA: Diagnosis not present

## 2021-10-09 DIAGNOSIS — K219 Gastro-esophageal reflux disease without esophagitis: Secondary | ICD-10-CM | POA: Diagnosis not present

## 2022-03-11 DIAGNOSIS — Z1231 Encounter for screening mammogram for malignant neoplasm of breast: Secondary | ICD-10-CM | POA: Diagnosis not present

## 2022-03-18 DIAGNOSIS — I10 Essential (primary) hypertension: Secondary | ICD-10-CM | POA: Diagnosis not present

## 2022-03-18 DIAGNOSIS — K219 Gastro-esophageal reflux disease without esophagitis: Secondary | ICD-10-CM | POA: Diagnosis not present

## 2022-03-18 DIAGNOSIS — I2510 Atherosclerotic heart disease of native coronary artery without angina pectoris: Secondary | ICD-10-CM | POA: Diagnosis not present

## 2022-03-18 DIAGNOSIS — R0789 Other chest pain: Secondary | ICD-10-CM | POA: Diagnosis not present

## 2022-03-26 DIAGNOSIS — Z8249 Family history of ischemic heart disease and other diseases of the circulatory system: Secondary | ICD-10-CM | POA: Diagnosis not present

## 2022-03-26 DIAGNOSIS — I10 Essential (primary) hypertension: Secondary | ICD-10-CM | POA: Diagnosis not present

## 2022-04-23 DIAGNOSIS — Z0000 Encounter for general adult medical examination without abnormal findings: Secondary | ICD-10-CM | POA: Diagnosis not present

## 2022-04-23 DIAGNOSIS — E1142 Type 2 diabetes mellitus with diabetic polyneuropathy: Secondary | ICD-10-CM | POA: Diagnosis not present

## 2022-04-23 DIAGNOSIS — K219 Gastro-esophageal reflux disease without esophagitis: Secondary | ICD-10-CM | POA: Diagnosis not present

## 2022-04-23 DIAGNOSIS — I10 Essential (primary) hypertension: Secondary | ICD-10-CM | POA: Diagnosis not present

## 2022-04-23 DIAGNOSIS — Z79899 Other long term (current) drug therapy: Secondary | ICD-10-CM | POA: Diagnosis not present

## 2022-04-30 DIAGNOSIS — Z8249 Family history of ischemic heart disease and other diseases of the circulatory system: Secondary | ICD-10-CM | POA: Diagnosis not present

## 2022-04-30 DIAGNOSIS — I2510 Atherosclerotic heart disease of native coronary artery without angina pectoris: Secondary | ICD-10-CM | POA: Diagnosis not present

## 2022-04-30 DIAGNOSIS — R0789 Other chest pain: Secondary | ICD-10-CM | POA: Diagnosis not present

## 2022-04-30 DIAGNOSIS — I10 Essential (primary) hypertension: Secondary | ICD-10-CM | POA: Diagnosis not present

## 2022-05-13 DIAGNOSIS — M85851 Other specified disorders of bone density and structure, right thigh: Secondary | ICD-10-CM | POA: Diagnosis not present

## 2022-05-13 DIAGNOSIS — M85852 Other specified disorders of bone density and structure, left thigh: Secondary | ICD-10-CM | POA: Diagnosis not present

## 2022-05-13 DIAGNOSIS — Z780 Asymptomatic menopausal state: Secondary | ICD-10-CM | POA: Diagnosis not present

## 2022-07-30 DIAGNOSIS — I10 Essential (primary) hypertension: Secondary | ICD-10-CM | POA: Diagnosis not present

## 2022-07-30 DIAGNOSIS — I2510 Atherosclerotic heart disease of native coronary artery without angina pectoris: Secondary | ICD-10-CM | POA: Diagnosis not present

## 2022-07-30 DIAGNOSIS — R9439 Abnormal result of other cardiovascular function study: Secondary | ICD-10-CM | POA: Diagnosis not present

## 2022-07-30 DIAGNOSIS — Z8249 Family history of ischemic heart disease and other diseases of the circulatory system: Secondary | ICD-10-CM | POA: Diagnosis not present

## 2022-09-17 DIAGNOSIS — E119 Type 2 diabetes mellitus without complications: Secondary | ICD-10-CM | POA: Diagnosis not present

## 2022-10-22 DIAGNOSIS — I5030 Unspecified diastolic (congestive) heart failure: Secondary | ICD-10-CM | POA: Diagnosis not present

## 2022-10-22 DIAGNOSIS — I517 Cardiomegaly: Secondary | ICD-10-CM | POA: Diagnosis not present

## 2022-10-29 DIAGNOSIS — R1310 Dysphagia, unspecified: Secondary | ICD-10-CM | POA: Diagnosis not present

## 2022-10-29 DIAGNOSIS — R1013 Epigastric pain: Secondary | ICD-10-CM | POA: Diagnosis not present

## 2022-11-19 DIAGNOSIS — E1142 Type 2 diabetes mellitus with diabetic polyneuropathy: Secondary | ICD-10-CM | POA: Diagnosis not present

## 2022-11-19 DIAGNOSIS — K219 Gastro-esophageal reflux disease without esophagitis: Secondary | ICD-10-CM | POA: Diagnosis not present

## 2022-11-19 DIAGNOSIS — Z1331 Encounter for screening for depression: Secondary | ICD-10-CM | POA: Diagnosis not present

## 2022-11-19 DIAGNOSIS — I10 Essential (primary) hypertension: Secondary | ICD-10-CM | POA: Diagnosis not present

## 2022-11-22 DIAGNOSIS — K5730 Diverticulosis of large intestine without perforation or abscess without bleeding: Secondary | ICD-10-CM | POA: Diagnosis not present

## 2022-11-22 DIAGNOSIS — E119 Type 2 diabetes mellitus without complications: Secondary | ICD-10-CM | POA: Diagnosis not present

## 2022-11-22 DIAGNOSIS — I10 Essential (primary) hypertension: Secondary | ICD-10-CM | POA: Diagnosis not present

## 2022-11-22 DIAGNOSIS — D128 Benign neoplasm of rectum: Secondary | ICD-10-CM | POA: Diagnosis not present

## 2022-11-22 DIAGNOSIS — Z1211 Encounter for screening for malignant neoplasm of colon: Secondary | ICD-10-CM | POA: Diagnosis not present

## 2022-11-22 DIAGNOSIS — D126 Benign neoplasm of colon, unspecified: Secondary | ICD-10-CM | POA: Diagnosis not present

## 2022-11-22 DIAGNOSIS — K621 Rectal polyp: Secondary | ICD-10-CM | POA: Diagnosis not present

## 2022-11-22 DIAGNOSIS — D122 Benign neoplasm of ascending colon: Secondary | ICD-10-CM | POA: Diagnosis not present

## 2022-11-22 DIAGNOSIS — K635 Polyp of colon: Secondary | ICD-10-CM | POA: Diagnosis not present

## 2023-01-28 DIAGNOSIS — Z8249 Family history of ischemic heart disease and other diseases of the circulatory system: Secondary | ICD-10-CM | POA: Diagnosis not present

## 2023-01-28 DIAGNOSIS — I10 Essential (primary) hypertension: Secondary | ICD-10-CM | POA: Diagnosis not present

## 2023-01-28 DIAGNOSIS — I2510 Atherosclerotic heart disease of native coronary artery without angina pectoris: Secondary | ICD-10-CM | POA: Diagnosis not present

## 2023-01-28 DIAGNOSIS — Z5320 Procedure and treatment not carried out because of patient's decision for unspecified reasons: Secondary | ICD-10-CM | POA: Diagnosis not present

## 2023-03-17 DIAGNOSIS — Z1231 Encounter for screening mammogram for malignant neoplasm of breast: Secondary | ICD-10-CM | POA: Diagnosis not present

## 2023-05-27 DIAGNOSIS — R0683 Snoring: Secondary | ICD-10-CM | POA: Diagnosis not present

## 2023-05-27 DIAGNOSIS — Z79899 Other long term (current) drug therapy: Secondary | ICD-10-CM | POA: Diagnosis not present

## 2023-05-27 DIAGNOSIS — Z23 Encounter for immunization: Secondary | ICD-10-CM | POA: Diagnosis not present

## 2023-05-27 DIAGNOSIS — I10 Essential (primary) hypertension: Secondary | ICD-10-CM | POA: Diagnosis not present

## 2023-05-27 DIAGNOSIS — K219 Gastro-esophageal reflux disease without esophagitis: Secondary | ICD-10-CM | POA: Diagnosis not present

## 2023-05-27 DIAGNOSIS — E1142 Type 2 diabetes mellitus with diabetic polyneuropathy: Secondary | ICD-10-CM | POA: Diagnosis not present
